# Patient Record
Sex: Female | Born: 1950 | Hispanic: Yes | Marital: Married
Health system: Southern US, Community
[De-identification: ages and names within clinical notes are randomized; demographics above are authoritative.]

## PROBLEM LIST (undated history)

## (undated) DIAGNOSIS — E119 Type 2 diabetes mellitus without complications: Secondary | ICD-10-CM

## (undated) DIAGNOSIS — E785 Hyperlipidemia, unspecified: Secondary | ICD-10-CM

## (undated) DIAGNOSIS — I10 Essential (primary) hypertension: Secondary | ICD-10-CM

---

## 2004-06-17 ENCOUNTER — Ambulatory Visit: Payer: Self-pay | Admitting: Family Medicine

## 2004-11-19 ENCOUNTER — Ambulatory Visit: Payer: Self-pay | Admitting: General Practice

## 2005-08-12 ENCOUNTER — Ambulatory Visit: Payer: Self-pay | Admitting: Family Medicine

## 2005-08-27 ENCOUNTER — Ambulatory Visit: Payer: Self-pay | Admitting: Family Medicine

## 2005-12-22 ENCOUNTER — Ambulatory Visit: Payer: Self-pay | Admitting: General Practice

## 2006-04-14 ENCOUNTER — Ambulatory Visit: Payer: Self-pay | Admitting: Family Medicine

## 2006-08-06 ENCOUNTER — Emergency Department: Payer: Self-pay | Admitting: Unknown Physician Specialty

## 2006-08-26 ENCOUNTER — Ambulatory Visit: Payer: Self-pay | Admitting: Gastroenterology

## 2006-12-28 ENCOUNTER — Ambulatory Visit: Payer: Self-pay | Admitting: Endocrinology

## 2007-10-28 IMAGING — CR DG CHEST 2V
1 series · 2 of 2 positions shown · non-contrast
Comparison: none

REASON FOR EXAM: Cough
COMMENTS:

PROCEDURE:     DXR - DXR CHEST PA (OR AP) AND LATERAL  - August 06, 2006  [DATE]
RESULT:     The patient is taking a shallow inspiration.  With technique
taken into consideration, there does not appear to be evidence of acute
cardiopulmonary disease.

[Series 1: view not recorded · 0.17mm/px · 2 of 2 slices shown]
[im 1/2]
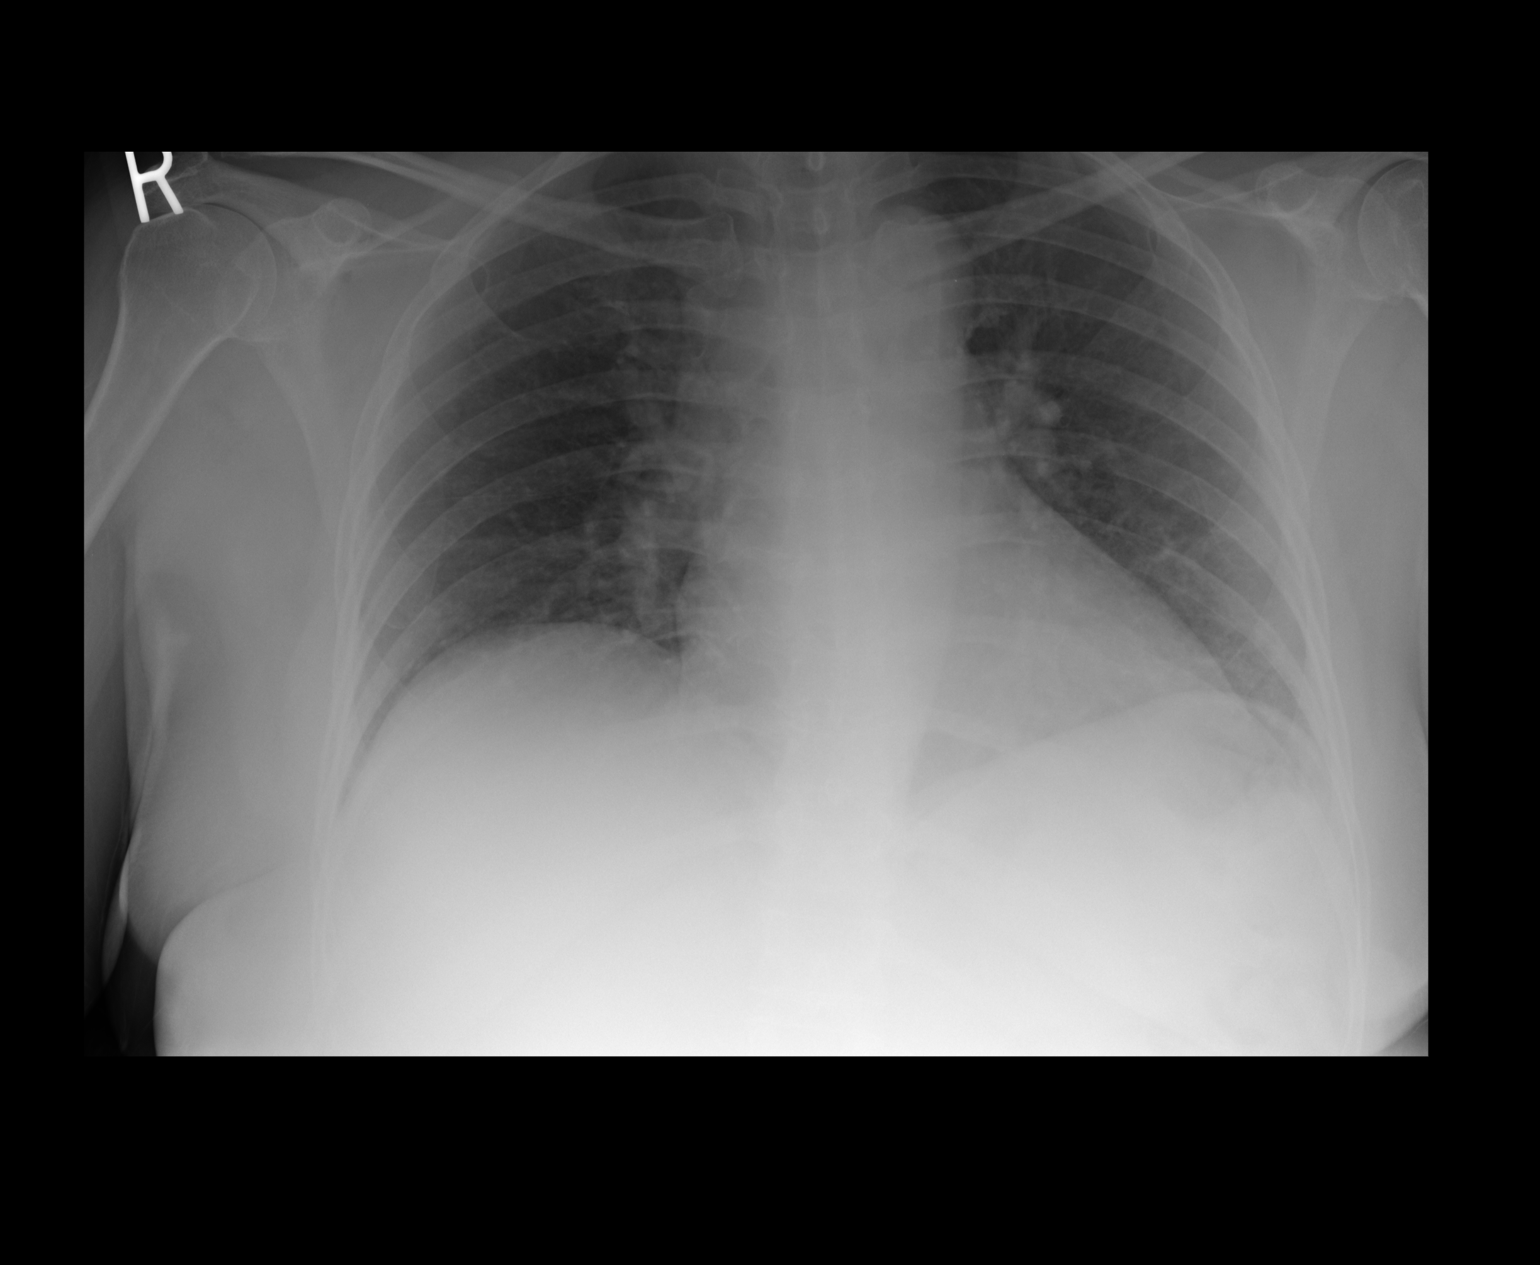
[im 2/2]
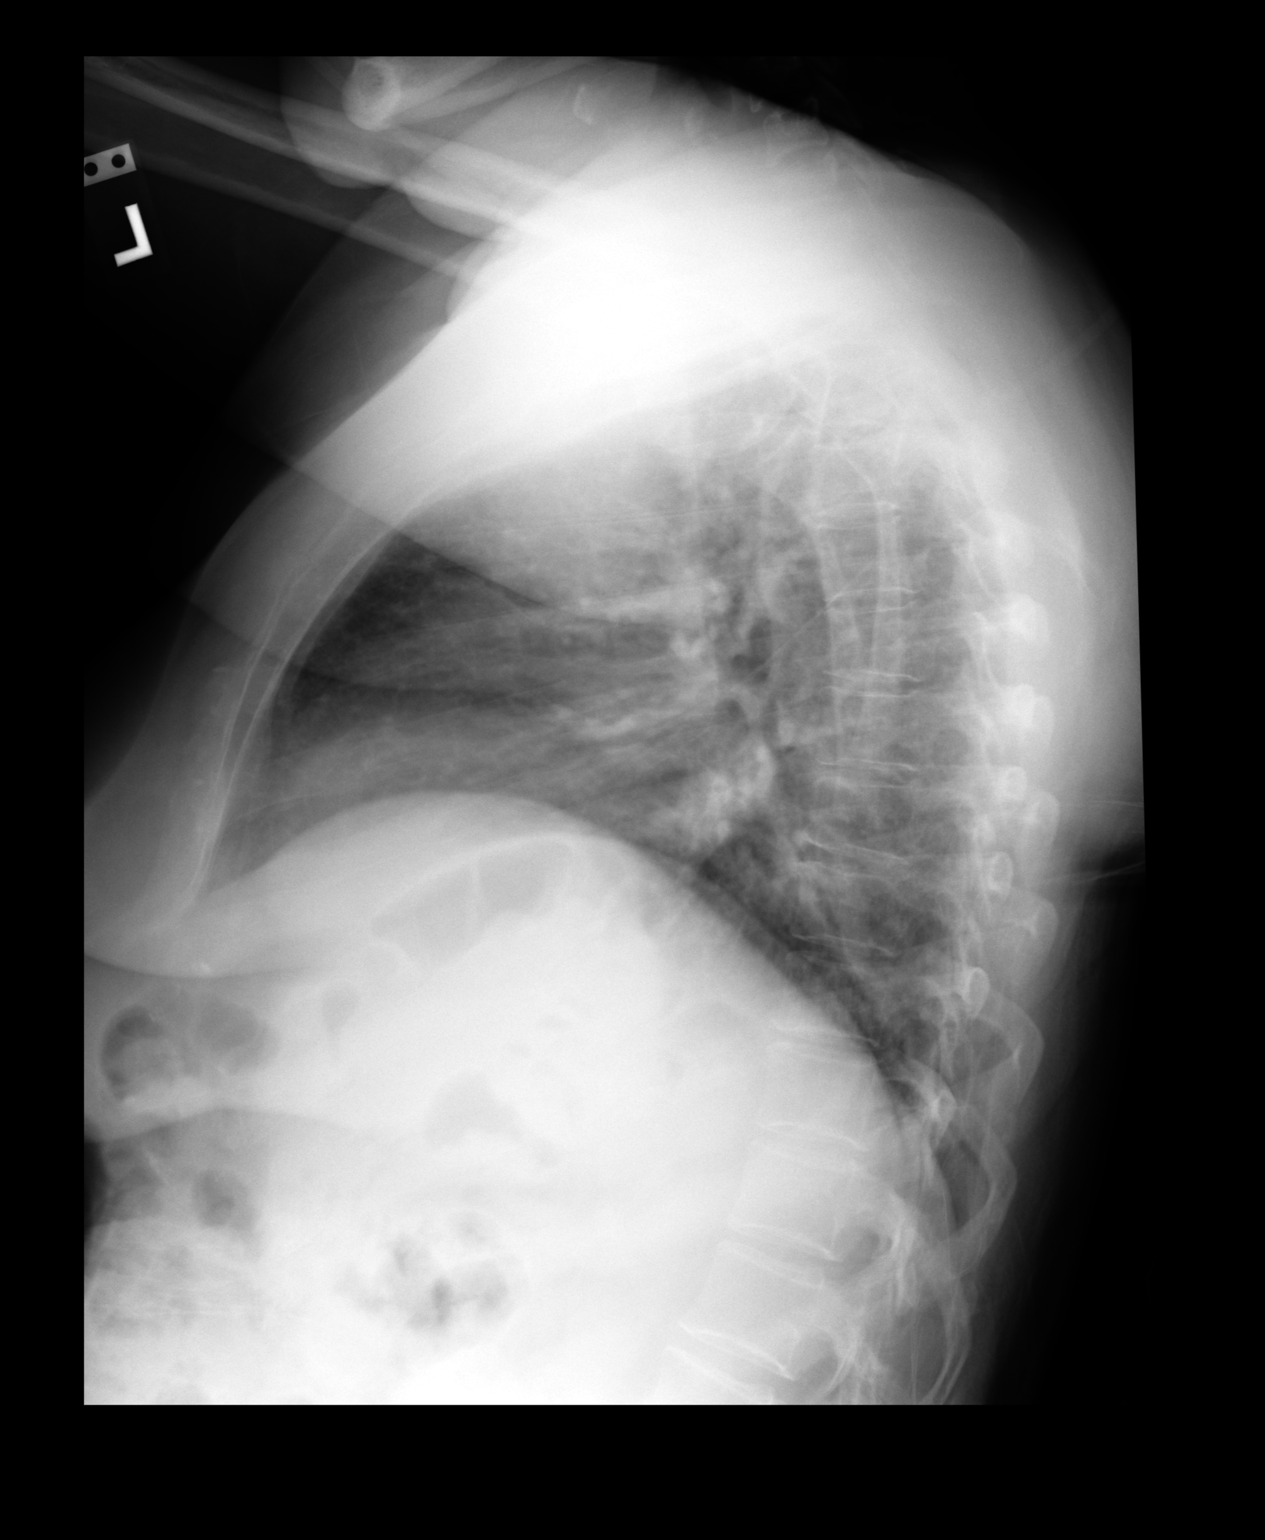

[2 of 2 positions shown; findings below may reference images not displayed]

IMPRESSION: Please see above.

## 2008-06-12 ENCOUNTER — Ambulatory Visit: Payer: Self-pay

## 2008-07-13 ENCOUNTER — Ambulatory Visit: Payer: Self-pay

## 2011-04-16 ENCOUNTER — Ambulatory Visit: Payer: Self-pay

## 2012-11-20 ENCOUNTER — Emergency Department: Payer: Self-pay | Admitting: Emergency Medicine

## 2012-11-20 LAB — URINALYSIS, COMPLETE
Ketone: NEGATIVE
Protein: 100
Squamous Epithelial: 1

## 2012-11-20 LAB — COMPREHENSIVE METABOLIC PANEL
Albumin: 4.3 g/dL (ref 3.4–5.0)
Alkaline Phosphatase: 88 U/L (ref 50–136)
BUN: 15 mg/dL (ref 7–18)
Bilirubin,Total: 0.3 mg/dL (ref 0.2–1.0)
Calcium, Total: 9.4 mg/dL (ref 8.5–10.1)
Chloride: 103 mmol/L (ref 98–107)
Co2: 27 mmol/L (ref 21–32)
EGFR (African American): >60
EGFR (Non-African Amer.): >60
Osmolality: 272 (ref 275–301)
SGOT(AST): 23 U/L (ref 15–37)

## 2012-11-20 LAB — CBC
HCT: 33.1 % — ABNORMAL LOW (ref 35.0–47.0)
MCHC: 33 g/dL (ref 32.0–36.0)
RBC: 4.24 10*6/uL (ref 3.80–5.20)
RDW: 14.6 % — ABNORMAL HIGH (ref 11.5–14.5)
WBC: 10.7 10*3/uL (ref 3.6–11.0)

## 2012-11-23 LAB — URINE CULTURE

## 2013-03-09 ENCOUNTER — Ambulatory Visit: Payer: Self-pay | Admitting: Family Medicine

## 2013-06-29 HISTORY — PX: ABDOMINAL HYSTERECTOMY: SHX81

## 2014-08-27 ENCOUNTER — Ambulatory Visit: Payer: Self-pay | Admitting: Nurse Practitioner

## 2020-04-08 ENCOUNTER — Other Ambulatory Visit: Payer: Self-pay

## 2020-04-08 DIAGNOSIS — Z20822 Contact with and (suspected) exposure to covid-19: Secondary | ICD-10-CM

## 2020-04-09 ENCOUNTER — Telehealth: Payer: Self-pay | Admitting: *Deleted

## 2020-04-09 NOTE — Telephone Encounter (Signed)
Patient called [facility] to check on COVID-19 test results, as she is needing the results to travel out of the country later this week.  Patient and family member informed that results are still pending.   No other questions or concerns.

## 2020-04-10 LAB — SARS-COV-2, NAA 2 DAY TAT

## 2020-04-10 LAB — NOVEL CORONAVIRUS, NAA: SARS-CoV-2, NAA: NOT DETECTED

## 2022-06-05 ENCOUNTER — Other Ambulatory Visit: Payer: Self-pay

## 2022-06-05 DIAGNOSIS — Z1231 Encounter for screening mammogram for malignant neoplasm of breast: Secondary | ICD-10-CM

## 2022-06-29 ENCOUNTER — Other Ambulatory Visit: Payer: Self-pay

## 2022-06-29 ENCOUNTER — Emergency Department
Admission: EM | Admit: 2022-06-29 | Discharge: 2022-06-29 | Disposition: A | Discharge status: Home / Self Care | Payer: BLUE CROSS/BLUE SHIELD | Attending: Emergency Medicine | Admitting: Emergency Medicine

## 2022-06-29 DIAGNOSIS — B029 Zoster without complications: Secondary | ICD-10-CM | POA: Diagnosis not present

## 2022-06-29 DIAGNOSIS — R21 Rash and other nonspecific skin eruption: Secondary | ICD-10-CM | POA: Diagnosis present

## 2022-06-29 MED ORDER — METHYLPREDNISOLONE 4 MG PO TBPK: ORAL_TABLET | ORAL | 0 refills | Status: AC

## 2022-06-29 MED ORDER — OXYCODONE-ACETAMINOPHEN 5-325 MG PO TABS: 1.0000 | ORAL_TABLET | ORAL | 0 refills | Status: AC | PRN

## 2022-06-29 MED ORDER — FAMCICLOVIR 500 MG PO TABS: 500.0000 mg | ORAL_TABLET | Freq: Three times a day (TID) | ORAL | 0 refills | Status: AC

## 2022-06-29 NOTE — ED Notes (Signed)
Pt d/c by provider.

## 2022-06-29 NOTE — Discharge Instructions (Signed)
Follow up with your regular doctor if not improving in 3 days, return if worsening Use the medication as prescribed

## 2022-06-29 NOTE — ED Triage Notes (Signed)
Painful rash under left arm and upper back. X 1 week.   PA in triage to assess and discharge at this time.

## 2022-06-29 NOTE — ED Provider Notes (Signed)
   [facility] Provider Note    Event Date/Time   First MD Initiated Contact with Patient 06/29/22 1257     (approximate)   History   Rash   HPI  Sarah Malone is a 72 y.o. female with no significant past medical history presents emergency department with painful rashes on her left arm and across her left chest.  Symptoms for 1 week.  States has been getting worse.  No fever or chills.  No shortness of breath      Physical Exam   Triage Vital Signs: ED Triage Vitals [06/29/22 1257]  Enc Vitals Group     BP      Pulse      Resp      Temp      Temp src      SpO2      Weight 140 lb (63.5 kg)     Height      Head Circumference      Peak Flow      Pain Score 10     Pain Loc      Pain Edu?      Excl. in [place]?     Most recent vital signs: There were no vitals filed for this visit.   General: Awake, no distress.   CV:  Good peripheral perfusion. regular rate and  rhythm Resp:  Normal effort. Lungs cta Abd:  No distention.   Other:  Skin with erythematous based vesicles noted in a dermatome   ED Results / Procedures / Treatments   Labs (all labs ordered are listed, but only abnormal results are displayed) Labs Reviewed - No data to display   EKG     RADIOLOGY     PROCEDURES:   Procedures   MEDICATIONS ORDERED IN ED: Medications - No data to display   IMPRESSION / MDM / [place] / ED COURSE  I reviewed the triage vital signs and the nursing notes.                              Differential diagnosis includes, but is not limited to, shingles, poison ivy, contact dermatitis  Patient's presentation is most consistent with acute, uncomplicated illness.   Patient exam is very consistent with herpes zoster.  She was given a prescription for Famvir, Medrol Dosepak, Percocet.  She is to follow-up with regular doctor if not improving 3 days.  Return if worsening.  She is in agreement treatment plan.   Everything was interpreted by [facility] interpreter Williams.      FINAL CLINICAL IMPRESSION(S) / ED DIAGNOSES   Final diagnoses:  Herpes zoster without complication     Rx / DC Orders   ED Discharge Orders          Ordered    famciclovir (FAMVIR) 500 MG tablet  3 times daily        06/29/22 1259    methylPREDNISolone (MEDROL DOSEPAK) 4 MG TBPK tablet        06/29/22 1259    oxyCODONE-acetaminophen (PERCOCET) 5-325 MG tablet  Every 4 hours PRN        06/29/22 1259             Note:  This document was prepared using Dragon voice recognition software and may include unintentional dictation errors.    Versie Starks, PA-C 06/29/22 1430    Nathaniel Man, MD 06/29/22 [phone]

## 2023-08-14 ENCOUNTER — Emergency Department
Admission: EM | Admit: 2023-08-14 | Discharge: 2023-08-14 | Disposition: A | Discharge status: Home / Self Care | Payer: Medicare HMO | Attending: Emergency Medicine | Admitting: Emergency Medicine

## 2023-08-14 ENCOUNTER — Other Ambulatory Visit: Payer: Self-pay

## 2023-08-14 ENCOUNTER — Emergency Department: Payer: Medicare HMO

## 2023-08-14 DIAGNOSIS — X58XXXA Exposure to other specified factors, initial encounter: Secondary | ICD-10-CM | POA: Insufficient documentation

## 2023-08-14 DIAGNOSIS — I10 Essential (primary) hypertension: Secondary | ICD-10-CM | POA: Diagnosis not present

## 2023-08-14 DIAGNOSIS — E119 Type 2 diabetes mellitus without complications: Secondary | ICD-10-CM | POA: Insufficient documentation

## 2023-08-14 DIAGNOSIS — S46811A Strain of other muscles, fascia and tendons at shoulder and upper arm level, right arm, initial encounter: Secondary | ICD-10-CM | POA: Insufficient documentation

## 2023-08-14 DIAGNOSIS — S4991XA Unspecified injury of right shoulder and upper arm, initial encounter: Secondary | ICD-10-CM | POA: Diagnosis present

## 2023-08-14 HISTORY — DX: Essential (primary) hypertension: I10

## 2023-08-14 HISTORY — DX: Hyperlipidemia, unspecified: E78.5

## 2023-08-14 HISTORY — DX: Type 2 diabetes mellitus without complications: E11.9

## 2023-08-14 MED ORDER — TRAMADOL HCL 50 MG PO TABS: 50.0000 mg | ORAL_TABLET | Freq: Four times a day (QID) | ORAL | 0 refills | Status: AC | PRN

## 2023-08-14 NOTE — ED Triage Notes (Signed)
Pt to ED with family member, pt complains of HA (whole head), R shoulder pain and R arm pain since about 2 weeks ago. Pt has full ROM. 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 Pt is Spanish speaking. Hx HTN.

## 2023-08-14 NOTE — ED Provider Notes (Signed)
[facility] Provider Note    Event Date/Time   First MD Initiated Contact with Patient 08/14/23 1153     (approximate)   History   Headache and Shoulder Pain   HPI  Sarah Malone is a 73 y.o. female with history of hypertension, DM, HLD and as listed in EMR presents to the emergency department for treatment and evaluation of right side neck, shoulder, and arm pain ongoing for the past 2 weeks. No specific injury. Symptoms decrease with Tylenol.      Physical Exam   Triage Vital Signs: ED Triage Vitals  Encounter Vitals Group     BP 08/14/23 1117 (!) 204/99     Systolic BP Percentile --      Diastolic BP Percentile --      Pulse Rate 08/14/23 1117 64     Resp 08/14/23 1117 20     Temp 08/14/23 1117 98.3 F (36.8 C)     Temp Source 08/14/23 1117 Oral     SpO2 08/14/23 1117 96 %     Weight 08/14/23 1118 139 lb (63 kg)     Height 08/14/23 1118 5' (1.524 m)     Head Circumference --      Peak Flow --      Pain Score 08/14/23 1114 10     Pain Loc --      Pain Education --      Exclude from Growth Chart --     Most recent vital signs: Vitals:   08/14/23 1117 08/14/23 1301  BP: (!) 204/99 (!) 188/82  Pulse: 64   Resp: 20   Temp: 98.3 F (36.8 C)   SpO2: 96%     General: Awake, no distress.  CV:  Good peripheral perfusion.  Resp:  Normal effort.  Abd:  No distention.  Other:  Reproducible pain over the right lateral neck going down over the anterior shoulder, around into the right axilla and into the subscapular area.  Pain increases with attempt to abduct.  Grip strength is equal.  No edema noted.  No step-off deformity of the shoulder.  No erythema or wound.   ED Results / Procedures / Treatments   Labs (all labs ordered are listed, but only abnormal results are displayed) Labs Reviewed - No data to display   EKG  Not indicated.   RADIOLOGY  Image and radiology report reviewed and interpreted by me. Radiology  report consistent with the same.  Image of the right shoulder shows mild to moderate acromioclavicular degenerative changes and mild diffuse osteopenia.  PROCEDURES:  Critical Care performed: No  Procedures   MEDICATIONS ORDERED IN ED:  Medications - No data to display   IMPRESSION / MDM / ASSESSMENT AND PLAN / ED COURSE   I have reviewed the triage note.  Differential diagnosis includes, but is not limited to, overuse syndrome, osteoarthritis, cervical strain  Patient's presentation is most consistent with acute illness / injury with system symptoms.  73 year old female presenting to the emergency department for treatment and evaluation of ongoing right side headache, right side neck, shoulder, and arm pain.  She works in a [employer] so her job is very repetitive.  See HPI for further details.  She currently denies headache.  Imaging of the shoulder is negative for acute concerns but she does have some degenerative changes.  Results discussed with the patient and family via video interpreter.  Vital signs reviewed.  Patient is hypertensive.  She states that she  does take her blood pressure medication as prescribed.  She states that she has been concerned about her current symptoms and is nervous about being in the emergency department.  Repeat blood pressure was better however she is still hypertensive.  She is currently asymptomatic.  She does have a primary care provider who is following her for the hypertension and reports that she does currently have medication with refills.  She will be discharged home with symptomatic treatment for her neck and shoulder pain and advised to schedule an appointment with primary care for reevaluation of her blood pressure.  She was encouraged to make sure she takes her medications as prescribed.  ER return precautions given.      FINAL CLINICAL IMPRESSION(S) / ED DIAGNOSES   Final diagnoses:  Trapezius strain, right, initial encounter   Uncontrolled hypertension     Rx / DC Orders   ED Discharge Orders          Ordered    traMADol (ULTRAM) 50 MG tablet  Every 6 hours PRN        08/14/23 1326             Note:  This document was prepared using Dragon voice recognition software and may include unintentional dictation errors.   Chinita Pester, FNP 08/15/23 1532    Jene Every, MD 08/16/23 [phone]
